# Patient Record
Sex: Male | Born: 1945 | Race: White | Hispanic: No | Marital: Married | Smoking: Former smoker
Health system: Southern US, Community
[De-identification: ages and names within clinical notes are randomized; demographics above are authoritative.]

## PROBLEM LIST (undated history)

## (undated) DIAGNOSIS — I10 Essential (primary) hypertension: Secondary | ICD-10-CM

## (undated) DIAGNOSIS — E119 Type 2 diabetes mellitus without complications: Secondary | ICD-10-CM

## (undated) DIAGNOSIS — G912 (Idiopathic) normal pressure hydrocephalus: Secondary | ICD-10-CM

## (undated) HISTORY — PX: CORONARY ARTERY BYPASS GRAFT: SHX141

## (undated) HISTORY — PX: SHUNT REPLACEMENT: SHX5403

---

## 2009-07-18 ENCOUNTER — Ambulatory Visit: Payer: Self-pay | Admitting: Diagnostic Radiology

## 2009-07-18 ENCOUNTER — Emergency Department (HOSPITAL_BASED_OUTPATIENT_CLINIC_OR_DEPARTMENT_OTHER): Admission: EM | Admit: 2009-07-18 | Discharge: 2009-07-18 | Payer: Self-pay | Admitting: Emergency Medicine

## 2015-10-28 ENCOUNTER — Other Ambulatory Visit (INDEPENDENT_AMBULATORY_CARE_PROVIDER_SITE_OTHER): Payer: Self-pay | Admitting: Internal Medicine

## 2019-06-15 ENCOUNTER — Other Ambulatory Visit: Payer: Self-pay

## 2019-06-15 ENCOUNTER — Encounter (HOSPITAL_BASED_OUTPATIENT_CLINIC_OR_DEPARTMENT_OTHER): Payer: Self-pay

## 2019-06-15 ENCOUNTER — Emergency Department (HOSPITAL_BASED_OUTPATIENT_CLINIC_OR_DEPARTMENT_OTHER): Payer: Medicare HMO

## 2019-06-15 ENCOUNTER — Emergency Department (HOSPITAL_BASED_OUTPATIENT_CLINIC_OR_DEPARTMENT_OTHER)
Admission: EM | Admit: 2019-06-15 | Discharge: 2019-06-16 | Disposition: A | Discharge status: Home / Self Care | Payer: Medicare HMO | Attending: Emergency Medicine | Admitting: Emergency Medicine

## 2019-06-15 DIAGNOSIS — I10 Essential (primary) hypertension: Secondary | ICD-10-CM | POA: Insufficient documentation

## 2019-06-15 DIAGNOSIS — E119 Type 2 diabetes mellitus without complications: Secondary | ICD-10-CM | POA: Diagnosis not present

## 2019-06-15 DIAGNOSIS — Y9389 Activity, other specified: Secondary | ICD-10-CM | POA: Diagnosis not present

## 2019-06-15 DIAGNOSIS — Z87891 Personal history of nicotine dependence: Secondary | ICD-10-CM | POA: Diagnosis not present

## 2019-06-15 DIAGNOSIS — W0110XA Fall on same level from slipping, tripping and stumbling with subsequent striking against unspecified object, initial encounter: Secondary | ICD-10-CM | POA: Insufficient documentation

## 2019-06-15 DIAGNOSIS — S0181XA Laceration without foreign body of other part of head, initial encounter: Secondary | ICD-10-CM | POA: Diagnosis not present

## 2019-06-15 DIAGNOSIS — Y92481 Parking lot as the place of occurrence of the external cause: Secondary | ICD-10-CM | POA: Diagnosis not present

## 2019-06-15 DIAGNOSIS — Y998 Other external cause status: Secondary | ICD-10-CM | POA: Insufficient documentation

## 2019-06-15 DIAGNOSIS — Z23 Encounter for immunization: Secondary | ICD-10-CM | POA: Insufficient documentation

## 2019-06-15 DIAGNOSIS — Z951 Presence of aortocoronary bypass graft: Secondary | ICD-10-CM | POA: Diagnosis not present

## 2019-06-15 DIAGNOSIS — W19XXXA Unspecified fall, initial encounter: Secondary | ICD-10-CM

## 2019-06-15 HISTORY — DX: Essential (primary) hypertension: I10

## 2019-06-15 HISTORY — DX: (Idiopathic) normal pressure hydrocephalus: G91.2

## 2019-06-15 HISTORY — DX: Type 2 diabetes mellitus without complications: E11.9

## 2019-06-15 MED ORDER — LIDOCAINE-EPINEPHRINE (PF) 1 %-1:200000 IJ SOLN
INTRAMUSCULAR | Status: AC
  Filled 2019-06-15: qty 10

## 2019-06-15 MED ORDER — LIDOCAINE-EPINEPHRINE-TETRACAINE (LET) SOLUTION
NASAL | Status: AC
  Filled 2019-06-15: qty 3

## 2019-06-15 MED ORDER — TETANUS-DIPHTH-ACELL PERTUSSIS 5-2.5-18.5 LF-MCG/0.5 IM SUSP
0.5000 mL | Freq: Once | INTRAMUSCULAR | Status: AC
  Administered 2019-06-15: 0.5 mL via INTRAMUSCULAR
  Filled 2019-06-15: qty 0.5

## 2019-06-15 NOTE — ED Notes (Signed)
Pt to CT

## 2019-06-15 NOTE — ED Notes (Signed)
ED Provider at bedside. 

## 2019-06-15 NOTE — ED Triage Notes (Signed)
Pt tripped/fell ~20 min PTA-lac to right eye brow-abrasions to both knees-no LOC-NAD-steady gait

## 2019-06-16 ENCOUNTER — Encounter (HOSPITAL_BASED_OUTPATIENT_CLINIC_OR_DEPARTMENT_OTHER): Payer: Self-pay | Admitting: Emergency Medicine

## 2019-06-16 NOTE — ED Provider Notes (Signed)
[facility] EMERGENCY DEPARTMENT Provider Note   CSN: [id number] Arrival date & time: 06/15/19  2118     History   Chief Complaint Chief Complaint  Patient presents with  . Fall    HPI Jacob Villarreal is a 73 y.o. male.     The history is provided by the patient and the spouse.  Fall This is a new problem. The current episode started 1 to 2 hours ago. The problem occurs rarely. The problem has been resolved. Pertinent negatives include no chest pain, no abdominal pain, no headaches and no shortness of breath. Associated symptoms comments: Facial laceration. Nothing aggravates the symptoms. Nothing relieves the symptoms. He has tried nothing for the symptoms. The treatment provided no relief.  Patient tripped over cement barrier in parking lot just prior to arrival.  Laceration just lateral to right eyebrow and abrasion on right knee and shin and right palm.  Tetanus UTD.    Past Medical History:  Diagnosis Date  . Diabetes mellitus without complication (HCC)   . Hypertension   . NPH (normal pressure hydrocephalus) (HCC)     There are no active problems to display for this patient.   Past Surgical History:  Procedure Laterality Date  . CORONARY ARTERY BYPASS GRAFT    . SHUNT REPLACEMENT          Home Medications    Prior to Admission medications   Not on File    Family History No family history on file.  Social History Social History   Tobacco Use  . Smoking status: Former Games developermoker  . Smokeless tobacco: Never Used  Substance Use Topics  . Alcohol use: Never    Frequency: Never  . Drug use: Never     Allergies   Patient has no known allergies.   Review of Systems Review of Systems  Constitutional: Negative for fever.  Eyes: Negative for visual disturbance.  Respiratory: Negative for cough and shortness of breath.   Cardiovascular: Negative for chest pain.  Gastrointestinal: Negative for abdominal pain.  Musculoskeletal: Positive for  arthralgias. Negative for back pain and neck stiffness.  Skin: Positive for wound.  Neurological: Negative for seizures, speech difficulty, weakness, numbness and headaches.  All other systems reviewed and are negative.    Physical Exam Updated Vital Signs BP (!) 178/90 (BP Location: Left Arm)   Pulse 85   Temp 98.1 F (36.7 C) (Oral)   Resp 18   Ht 5\' 9"  (1.753 m)   Wt 81.6 kg   SpO2 99%   BMI 26.58 kg/m   Physical Exam Vitals signs and nursing note reviewed.  Constitutional:      General: He is not in acute distress.    Appearance: He is normal weight.  HENT:     Head: Normocephalic.      Right Ear: Tympanic membrane normal.     Left Ear: Tympanic membrane normal.     Nose: Nose normal.     Mouth/Throat:     Mouth: Mucous membranes are moist.     Pharynx: Oropharynx is clear.  Eyes:     Conjunctiva/sclera: Conjunctivae normal.     Pupils: Pupils are equal, round, and reactive to light.  Neck:     Musculoskeletal: Normal range of motion and neck supple.  Cardiovascular:     Rate and Rhythm: Normal rate and regular rhythm.     Pulses: Normal pulses.     Heart sounds: Normal heart sounds.  Pulmonary:     Effort: Pulmonary  effort is normal. No respiratory distress.     Breath sounds: Normal breath sounds. No wheezing.  Abdominal:     General: Abdomen is flat. Bowel sounds are normal.     Tenderness: There is no abdominal tenderness. There is no guarding or rebound.  Skin:    General: Skin is warm and dry.     Capillary Refill: Capillary refill takes less than 2 seconds.       Neurological:     General: No focal deficit present.     Mental Status: He is alert and oriented to person, place, and time.     Deep Tendon Reflexes: Reflexes normal.  Psychiatric:        Mood and Affect: Mood normal.        Behavior: Behavior normal.      ED Treatments / Results  Labs (all labs ordered are listed, but only abnormal results are displayed) Labs Reviewed - No data  to display  EKG None  Radiology Ct Head Wo Contrast  Result Date: 06/15/2019 CLINICAL DATA:  73 year old male with fall. EXAM: CT HEAD WITHOUT CONTRAST CT CERVICAL SPINE WITHOUT CONTRAST TECHNIQUE: Multidetector CT imaging of the head and cervical spine was performed following the standard protocol without intravenous contrast. Multiplanar CT image reconstructions of the cervical spine were also generated. COMPARISON:  Head CT dated 07/29/2018 FINDINGS: CT HEAD FINDINGS Brain: Right frontal ventriculostomy shunt with tip in the third ventricle. No significant interval change in the dilatation of the ventricular system compared to the prior CT. There is mild age-related atrophy and chronic microvascular ischemic changes. There is no acute intracranial hemorrhage. No mass effect or midline shift. No extra-axial fluid collection. Vascular: No hyperdense vessel or unexpected calcification. Skull: No acute calvarial pathology. Right frontal ventriculostomy burr hole. Sinuses/Orbits: No acute finding. Other: None CT CERVICAL SPINE FINDINGS Alignment: No acute subluxation. There is straightening of normal cervical lordosis which may be positional or due to muscle spasm Skull base and vertebrae: . No acute fracture. Osteopenia. Soft tissues and spinal canal: No prevertebral fluid or swelling. No visible canal hematoma. Disc levels: Multilevel degenerative changes with endplate irregularity and disc space narrowing. Multilevel anterior osteophyte. Upper chest: Negative. Other: Bilateral carotid bulb calcified plaques. IMPRESSION: 1. No acute intracranial hemorrhage. 2. No acute/traumatic cervical spine pathology. Electronically Signed   By: Elgie CollardArash  Radparvar M.D.   On: 06/15/2019 23:56   Ct Cervical Spine Wo Contrast  Result Date: 06/15/2019 CLINICAL DATA:  73 year old male with fall. EXAM: CT HEAD WITHOUT CONTRAST CT CERVICAL SPINE WITHOUT CONTRAST TECHNIQUE: Multidetector CT imaging of the head and cervical  spine was performed following the standard protocol without intravenous contrast. Multiplanar CT image reconstructions of the cervical spine were also generated. COMPARISON:  Head CT dated 07/29/2018 FINDINGS: CT HEAD FINDINGS Brain: Right frontal ventriculostomy shunt with tip in the third ventricle. No significant interval change in the dilatation of the ventricular system compared to the prior CT. There is mild age-related atrophy and chronic microvascular ischemic changes. There is no acute intracranial hemorrhage. No mass effect or midline shift. No extra-axial fluid collection. Vascular: No hyperdense vessel or unexpected calcification. Skull: No acute calvarial pathology. Right frontal ventriculostomy burr hole. Sinuses/Orbits: No acute finding. Other: None CT CERVICAL SPINE FINDINGS Alignment: No acute subluxation. There is straightening of normal cervical lordosis which may be positional or due to muscle spasm Skull base and vertebrae: . No acute fracture. Osteopenia. Soft tissues and spinal canal: No prevertebral fluid or swelling. No visible canal  hematoma. Disc levels: Multilevel degenerative changes with endplate irregularity and disc space narrowing. Multilevel anterior osteophyte. Upper chest: Negative. Other: Bilateral carotid bulb calcified plaques. IMPRESSION: 1. No acute intracranial hemorrhage. 2. No acute/traumatic cervical spine pathology. Electronically Signed   By: Anner Crete M.D.   On: 06/15/2019 23:56    Procedures .Marland KitchenLaceration Repair  Date/Time: 06/16/2019 12:52 AM Performed by: Veatrice Kells, MD Authorized by: Veatrice Kells, MD   Consent:    Consent obtained:  Verbal   Consent given by:  Patient   Risks discussed:  Infection, need for additional repair, nerve damage, pain, retained foreign body, tendon damage, vascular damage, poor cosmetic result and poor wound healing   Alternatives discussed:  No treatment Anesthesia (see MAR for exact dosages):    Anesthesia  method:  Topical application and local infiltration   Topical anesthetic:  LET   Local anesthetic:  Lidocaine 1% WITH epi Laceration details:    Location:  Face   Face location:  R eyebrow (lateral to eyebrow)   Length (cm):  1.5   Depth (mm):  1 Repair type:    Repair type:  Intermediate Pre-procedure details:    Preparation:  Patient was prepped and draped in usual sterile fashion Exploration:    Hemostasis achieved with:  LET and direct pressure   Wound exploration: wound explored through full range of motion     Wound extent: no areolar tissue violation noted     Contaminated: no   Treatment:    Area cleansed with:  Betadine and Hibiclens   Amount of cleaning:  Extensive Skin repair:    Repair method:  Sutures   Suture size:  6-0   Wound skin closure material used: vicryl.   Suture technique:  Simple interrupted and subcuticular   Number of sutures:  5 Approximation:    Approximation:  Close Post-procedure details:    Dressing:  Sterile dressing   Patient tolerance of procedure:  Tolerated well, no immediate complications   (including critical care time)  Medications Ordered in ED Medications  lidocaine-EPINEPHrine-tetracaine (LET) solution (has no administration in time range)  lidocaine-EPINEPHrine (XYLOCAINE-EPINEPHrine) 1 %-1:200000 (PF) injection (has no administration in time range)  Tdap (BOOSTRIX) injection 0.5 mL (0.5 mLs Intramuscular Given 06/15/19 2340)     Wounds cleansed by me with bacitracin applied. Pressure dressing to the lateral aspect of wound as abraded skin continued scant ooze.  Wound sealer placed.   Final Clinical Impressions(s) / ED Diagnoses   Final diagnoses:  Fall, initial encounter  Facial laceration, initial encounter    Return for intractable cough, coughing up blood,fevers >100.4 unrelieved by medication, shortness of breath, intractable vomiting, chest pain, shortness of breath, weakness,numbness, changes in speech, facial  asymmetry,abdominal pain, passing out,Inability to tolerate liquids or food, cough, altered mental status or any concerns. No signs of systemic illness or infection. The patient is nontoxic-appearing on exam and vital signs are within normal limits.   I have reviewed the triage vital signs and the nursing notes. Pertinent labs &imaging results that were available during my care of the patient were reviewed by me and considered in my medical decision making (see chart for details).  After history, exam, and medical workup I feel the patient has been appropriately medically screened and is safe for discharge home. Pertinent diagnoses were discussed with the patient. Patient was given return precautions   Atlee Villers, MD 06/16/19 3299

## 2020-06-01 IMAGING — CT CT HEAD WITHOUT CONTRAST
3 of 8 series · 12 of 47 positions shown, 14 images · non-contrast
Comparison: Head CT dated 07/29/2018

CLINICAL DATA: 73-year-old male with fall.

EXAM:
CT HEAD WITHOUT CONTRAST
CT CERVICAL SPINE WITHOUT CONTRAST
TECHNIQUE: Multidetector CT imaging of the head and cervical spine was
performed following the standard protocol without intravenous
contrast. Multiplanar CT image reconstructions of the cervical spine
were also generated.

[Series 4: head 3.0 mpr cor · coronal · 0.35mm/px · 3 of 67 slices shown]
[im 4/67  brain]
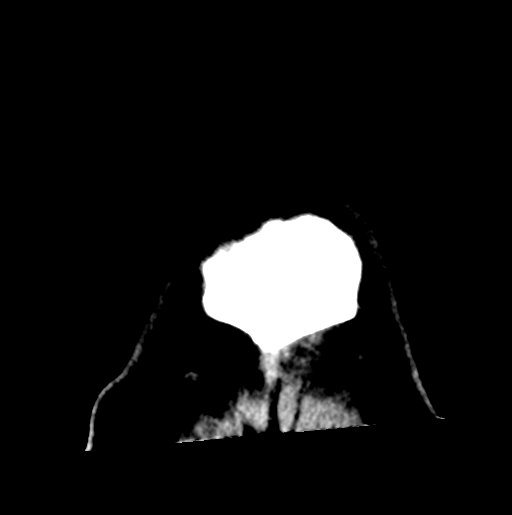
[im 7/67  brain]
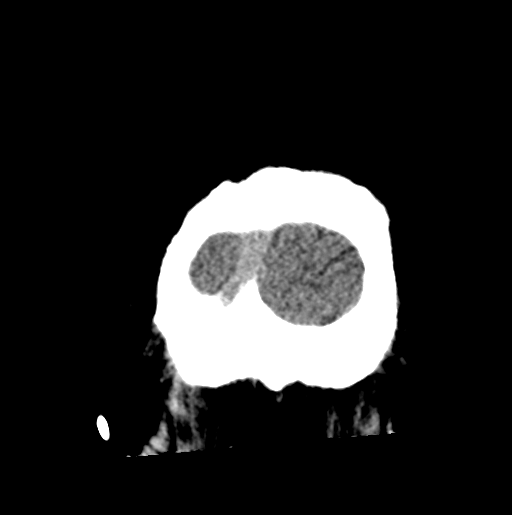
[im 10/67  brain]
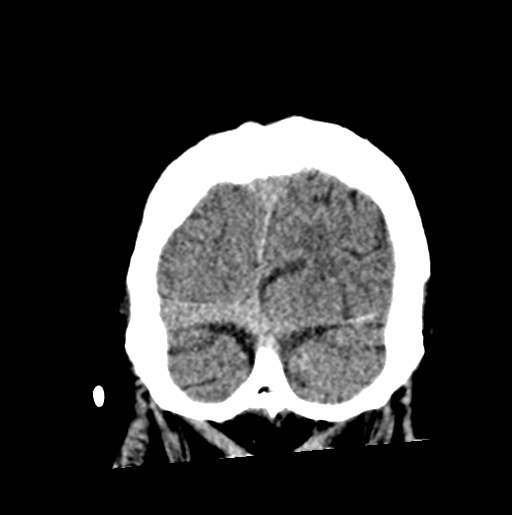

[Series 10: sagittals · sagittal · 0.26mm/px · 2 of 93 slices shown]
[im 31/93  brain]
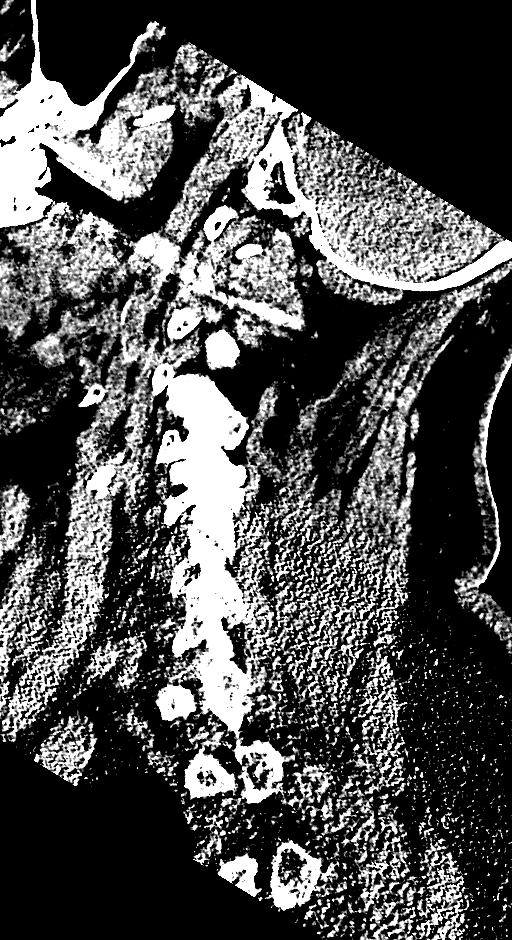
[im 62/93  brain]
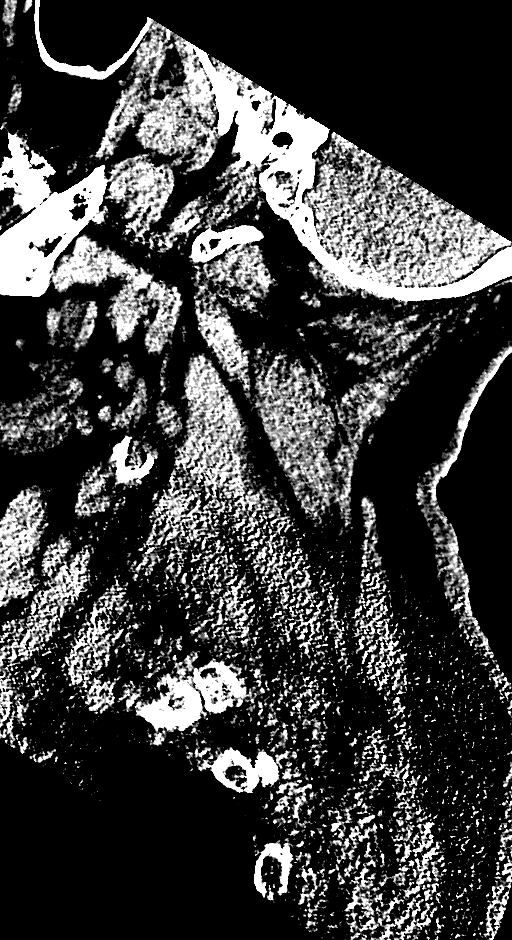

[Series 11: orthogonals · axial · 0.26mm/px · z∈[+666,+836]mm · 7 of 124 slices shown, 9 images]
[im 12/124  brain]
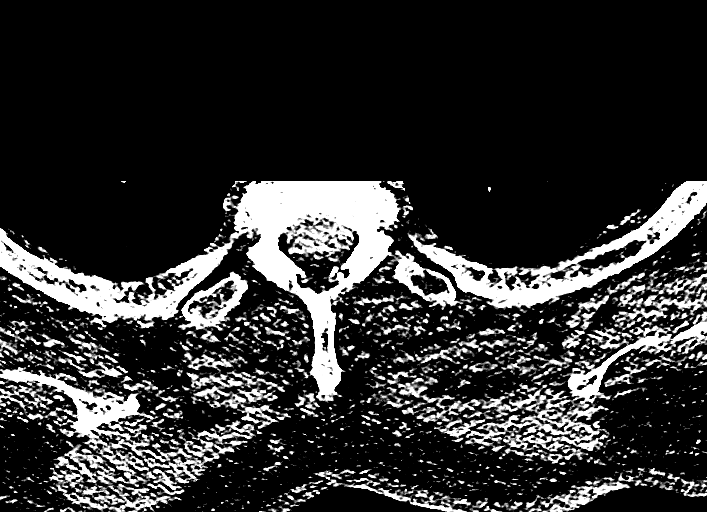
[im 12/124  bone]
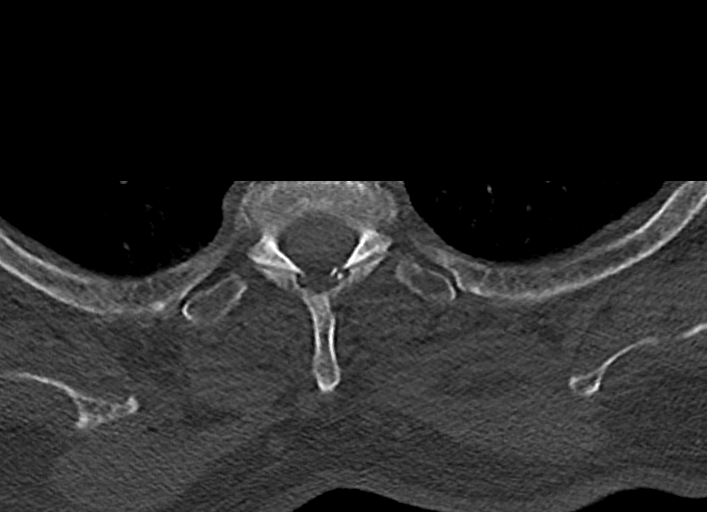
[im 34/124  brain]
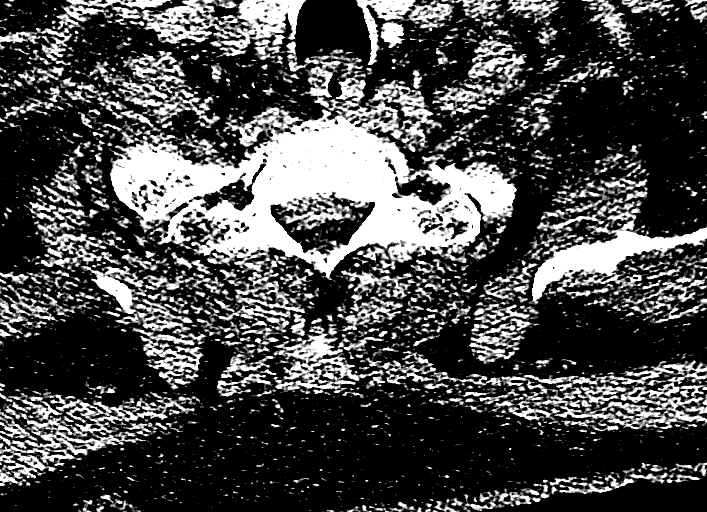
[im 45/124  brain]
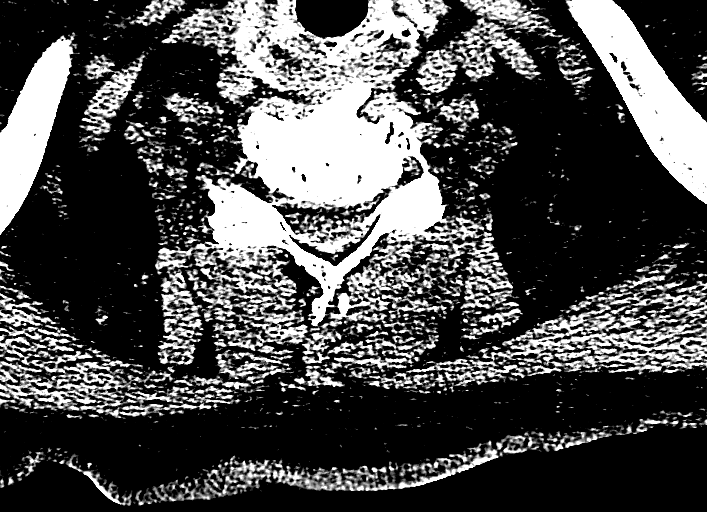
[im 68/124  brain]
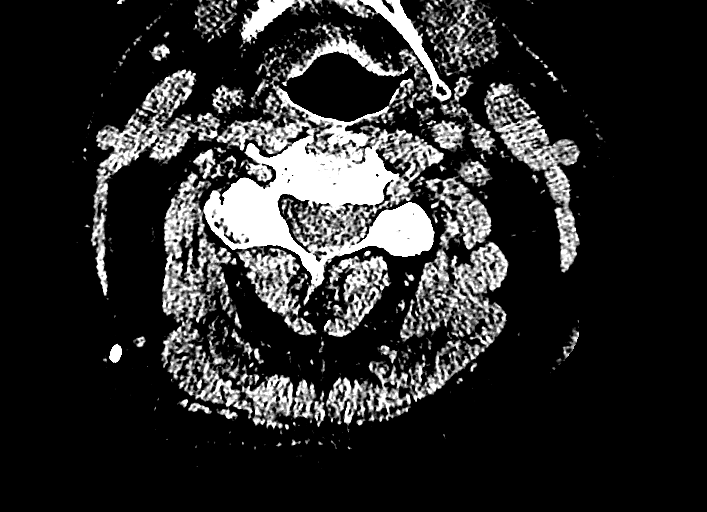
[im 79/124  brain]
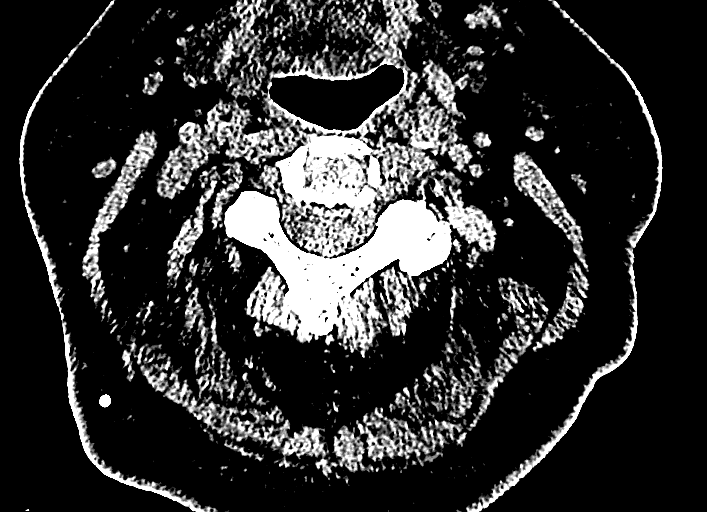
[im 79/124  bone]
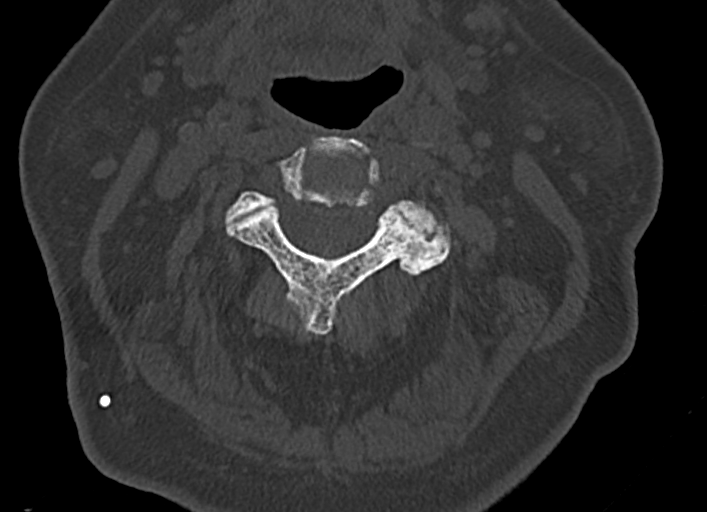
[im 90/124  brain]
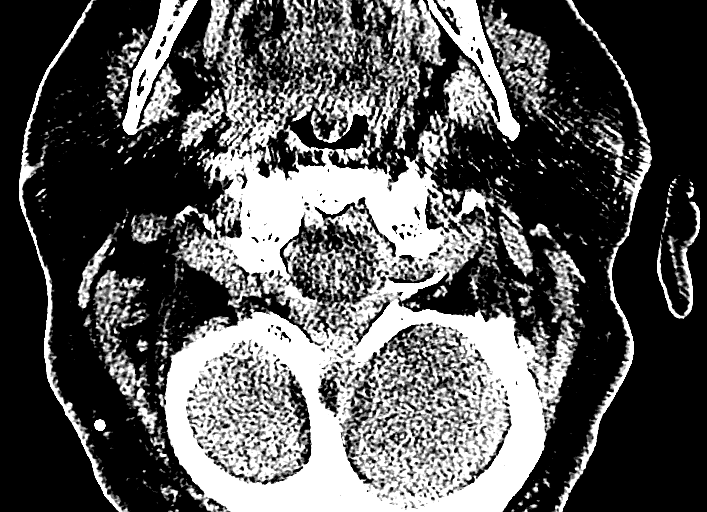
[im 112/124  brain]
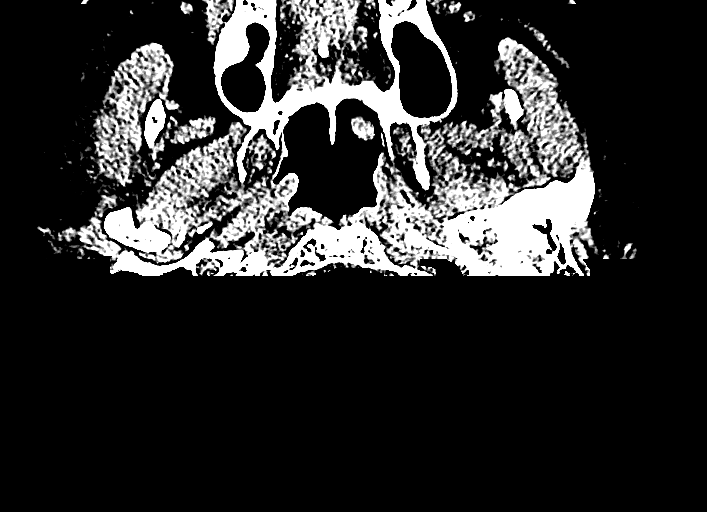

[12 of 47 positions shown; findings below may reference images not displayed]

FINDINGS: CT HEAD FINDINGS

Brain: Right frontal ventriculostomy shunt with tip in the third
ventricle. No significant interval change in the dilatation of the
ventricular system compared to the prior CT. There is mild
age-related atrophy and chronic microvascular ischemic changes.
There is no acute intracranial hemorrhage. No mass effect or midline
shift. No extra-axial fluid collection.

Vascular: No hyperdense vessel or unexpected calcification.

Skull: No acute calvarial pathology. Right frontal ventriculostomy
burr hole.

Sinuses/Orbits: No acute finding.

Other: None

CT CERVICAL SPINE FINDINGS

Alignment: No acute subluxation. There is straightening of normal
cervical lordosis which may be positional or due to muscle spasm

Skull base and vertebrae: . No acute fracture. Osteopenia.

Soft tissues and spinal canal: No prevertebral fluid or swelling. No
visible canal hematoma.

Disc levels: Multilevel degenerative changes with endplate
irregularity and disc space narrowing. Multilevel anterior
osteophyte.

Upper chest: Negative.

Other: Bilateral carotid bulb calcified plaques.
IMPRESSION: 1. No acute intracranial hemorrhage.
2. No acute/traumatic cervical spine pathology.

## 2021-10-25 DEATH — deceased
